# Patient Record
Sex: Female | Born: 1990 | Race: White | Hispanic: No | Marital: Single | Smoking: Never smoker
Health system: Southern US, Community
[De-identification: ages and names within clinical notes are randomized; demographics above are authoritative.]

## PROBLEM LIST (undated history)

## (undated) DIAGNOSIS — K219 Gastro-esophageal reflux disease without esophagitis: Secondary | ICD-10-CM

## (undated) DIAGNOSIS — R011 Cardiac murmur, unspecified: Secondary | ICD-10-CM

## (undated) DIAGNOSIS — L259 Unspecified contact dermatitis, unspecified cause: Secondary | ICD-10-CM

## (undated) DIAGNOSIS — G43909 Migraine, unspecified, not intractable, without status migrainosus: Secondary | ICD-10-CM

## (undated) DIAGNOSIS — F909 Attention-deficit hyperactivity disorder, unspecified type: Secondary | ICD-10-CM

## (undated) HISTORY — PX: OTHER SURGICAL HISTORY: SHX169

---

## 2013-09-09 ENCOUNTER — Ambulatory Visit: Payer: Self-pay

## 2017-10-01 ENCOUNTER — Other Ambulatory Visit: Payer: Self-pay

## 2017-10-01 ENCOUNTER — Encounter (HOSPITAL_COMMUNITY): Payer: Self-pay | Admitting: Emergency Medicine

## 2017-10-01 ENCOUNTER — Emergency Department (HOSPITAL_COMMUNITY)
Admission: EM | Admit: 2017-10-01 | Discharge: 2017-10-01 | Disposition: A | Discharge status: Home / Self Care | Payer: 59 | Attending: Emergency Medicine | Admitting: Emergency Medicine

## 2017-10-01 ENCOUNTER — Emergency Department (HOSPITAL_COMMUNITY): Payer: 59

## 2017-10-01 DIAGNOSIS — R195 Other fecal abnormalities: Secondary | ICD-10-CM | POA: Insufficient documentation

## 2017-10-01 DIAGNOSIS — Z79899 Other long term (current) drug therapy: Secondary | ICD-10-CM | POA: Insufficient documentation

## 2017-10-01 DIAGNOSIS — K2100 Gastro-esophageal reflux disease with esophagitis, without bleeding: Secondary | ICD-10-CM

## 2017-10-01 DIAGNOSIS — R1013 Epigastric pain: Secondary | ICD-10-CM | POA: Diagnosis present

## 2017-10-01 HISTORY — DX: Migraine, unspecified, not intractable, without status migrainosus: G43.909

## 2017-10-01 HISTORY — DX: Gastro-esophageal reflux disease without esophagitis: K21.9

## 2017-10-01 HISTORY — DX: Attention-deficit hyperactivity disorder, unspecified type: F90.9

## 2017-10-01 HISTORY — DX: Unspecified contact dermatitis, unspecified cause: L25.9

## 2017-10-01 HISTORY — DX: Cardiac murmur, unspecified: R01.1

## 2017-10-01 LAB — I-STAT CHEM 8, ED
BUN: 6 mg/dL (ref 6–20)
CHLORIDE: 103 mmol/L (ref 101–111)
CREATININE: 0.6 mg/dL (ref 0.44–1.00)
Calcium, Ion: 1.17 mmol/L (ref 1.15–1.40)
Glucose, Bld: 94 mg/dL (ref 65–99)
HEMATOCRIT: 43 % (ref 36.0–46.0)
Hemoglobin: 14.6 g/dL (ref 12.0–15.0)
POTASSIUM: 3.9 mmol/L (ref 3.5–5.1)
SODIUM: 140 mmol/L (ref 135–145)
TCO2: 24 mmol/L (ref 22–32)

## 2017-10-01 LAB — POC OCCULT BLOOD, ED: Fecal Occult Bld: POSITIVE — AB

## 2017-10-01 MED ORDER — GI COCKTAIL ~~LOC~~
30.0000 mL | Freq: Once | ORAL | Status: AC
  Administered 2017-10-01: 30 mL via ORAL
  Filled 2017-10-01: qty 30

## 2017-10-01 MED ORDER — LIDOCAINE VISCOUS 2 % MT SOLN: 15.0000 mL | Freq: Four times a day (QID) | OROMUCOSAL | 0 refills | Status: AC | PRN

## 2017-10-01 NOTE — ED Provider Notes (Signed)
[facility]ENC[facility] Provider Note   CSN: [id number] Arrival date & time: 10/01/17  1707     History   Chief Complaint Chief Complaint  Patient presents with  . Gastroesophageal Reflux    HPI Marilyn Kelly is a 26 y.o. female.  Patient with past medical history of GERD and hiatal hernia presents to the emergency department with a chief complaint of burning in throat and epigastric pain.  She states that the symptoms have been chronic, but worsened over the past several days.  She reports that her symptoms worsened after having an EGD.  He states that she had 3 biopsies taken at that time.  This was performed in [place].  She states that she has been taking Dexilant and simethicone with no relief.  She reports that she called her PCP, and was advised to come to the emergency department for evaluation.  She states that she may have had some blood in her stool, but is uncertain because she is starting her period.  She reports that the blood she has seen is red, not dark or black. She denies any fevers or chills.  Denies any other associated symptoms.   The history is provided by the patient. No language interpreter was used.    Past Medical History:  Diagnosis Date  . ADHD   . Contact dermatitis   . GERD (gastroesophageal reflux disease)   . Heart murmur   . Migraines     There are no active problems to display for this patient.   Past Surgical History:  Procedure Laterality Date  . lateral release on knee Right     OB History    No data available       Home Medications    Prior to Admission medications   Medication Sig Start Date End Date Taking? Authorizing Provider  alum & mag hydroxide-simeth (MAALOX/MYLANTA) 200-200-20 MG/5ML suspension Take 20 mLs every 6 (six) hours as needed by mouth for indigestion or heartburn.   Yes [provider]  cetirizine (ZYRTEC) 10 MG tablet Take 10 mg daily by mouth.   Yes [provider]  CONCERTA 36 MG CR tablet Take 36 mg daily by mouth. 09/16/17  Yes [provider]  DEXILANT 60 MG capsule Take 60 mg daily by mouth. 09/24/17  Yes [provider]  levonorgestrel-ethinyl estradiol (SEASONALE,INTROVALE,JOLESSA) 0.15-0.03 MG tablet Take 1 tablet daily by mouth. 07/15/17  Yes [provider]  pseudoephedrine (SUDAFED) 30 MG tablet Take 30 mg every 4 (four) hours as needed by mouth for congestion.   Yes [provider]  SUMAtriptan (IMITREX) 50 MG tablet Take 50 mg every 2 (two) hours as needed by mouth for migraine. May repeat in 2 hours if headache persists or recurs.   Yes [provider]    Family History History reviewed. No pertinent family history.  Social History Social History   Tobacco Use  . Smoking status: Never Smoker  . Smokeless tobacco: Never Used  Substance Use Topics  . Alcohol use: Yes    Comment: socially  . Drug use: Yes    Types: Marijuana     Allergies   Doxycycline; Naproxen; Ceftin [cefuroxime axetil]; and Tape   Review of Systems Review of Systems  All other systems reviewed and are negative.    Physical Exam Updated Vital Signs BP 134/84 (BP Location: Right Arm)   Pulse (!) 104   Temp 98.6 F (37 C) (Oral)   Resp 16  LMP 07/01/2017 Comment: On "seasonelle" birth control  SpO2 100%   Physical Exam  Constitutional: She is oriented to person, place, and time. She appears well-developed and well-nourished.  HENT:  Head: Normocephalic and atraumatic.  Eyes: Conjunctivae and EOM are normal. Pupils are equal, round, and reactive to light.  Neck: Normal range of motion. Neck supple.  Cardiovascular: Normal rate and regular rhythm. Exam reveals no gallop and no friction rub.  No murmur heard. Pulmonary/Chest: Effort normal and breath sounds normal. No respiratory distress. She has no wheezes. She has no rales. She exhibits no tenderness.  Abdominal: Soft. Bowel sounds are  normal. She exhibits no distension and no mass. There is no tenderness. There is no rebound and no guarding.  No focal abdominal tenderness, no RLQ tenderness or pain at McBurney's point, no RUQ tenderness or Murphy's sign, no left-sided abdominal tenderness, no fluid wave, or signs of peritonitis   Musculoskeletal: Normal range of motion. She exhibits no edema or tenderness.  Neurological: She is alert and oriented to person, place, and time.  Skin: Skin is warm and dry.  Psychiatric: She has a normal mood and affect. Her behavior is normal. Judgment and thought content normal.  Nursing note and vitals reviewed.    ED Treatments / Results  Labs (all labs ordered are listed, but only abnormal results are displayed) Labs Reviewed  POC OCCULT BLOOD, ED  I-STAT CHEM 8, ED    EKG  EKG Interpretation None       Radiology Dg Chest 2 View  Result Date: 10/01/2017 CLINICAL DATA:  Epigastric pain following endoscopy EXAM: CHEST  2 VIEW COMPARISON:  None. FINDINGS: The heart size and mediastinal contours are within normal limits. Both lungs are clear. The visualized skeletal structures are unremarkable.No free air. IMPRESSION: No free air or focal airspace disease. Electronically Signed   By: Deatra RobinsonKevin  Herman M.D.   On: 10/01/2017 18:39    Procedures Procedures (including critical care time)  Medications Ordered in ED Medications  gi cocktail (Maalox,Lidocaine,Donnatal) (not administered)  gi cocktail (Maalox,Lidocaine,Donnatal) (30 mLs Oral Given 10/01/17 1745)     Initial Impression / Assessment and Plan / ED Course  I have reviewed the triage vital signs and the nursing notes.  Pertinent labs & imaging results that were available during my care of the patient were reviewed by me and considered in my medical decision making (see chart for details).     Patient with burning in throat and epigastrium.  Recent EGD.  Known hx of esophagitis and GERD.  Currently taking Dexilant.   Improved after GI cocktail.  Stool occult blood test positive, but H/H is stable.  Abdomen is soft and non-tender.  No sign of acute abdomen.  CXR is normal, no free air.  Discussed with Dr. Eudelia Bunchardama, who recommends viscous lidocaine and maalox for home.  Discussed this with the patient, who understands and agrees with the plan.  She will follow-up with her GI.  [place] [facility] [place] [facility].  Final Clinical Impressions(s) / ED Diagnoses   Final diagnoses:  Esophagitis, reflux    ED Discharge Orders        Ordered    lidocaine (XYLOCAINE) 2 % solution  Every 6 hours PRN     10/01/17 2315       Roxy HorsemanBrowning, Tatelyn Vanhecke, PA-C 10/01/17 2320    Cardama, Amadeo GarnetPedro Eduardo, MD 10/02/17 0020

## 2017-10-01 NOTE — ED Notes (Signed)
Pt told nurse first tech that she had a BM, she observed her stool to be red and loose.

## 2017-10-01 NOTE — ED Notes (Signed)
Spoke to Dr. Eudelia Bunchardama about patient.  Orders placed per recommendations

## 2017-10-01 NOTE — ED Notes (Signed)
Patient verbalized understanding of discharge instructions and denies any further needs or questions at this time. VS stable. Patient ambulatory with steady gait.  

## 2017-10-01 NOTE — ED Triage Notes (Signed)
Pt presents to ED after having an Endoscopy on 11/7.  States she was diagnosed with hiatal hernia, gastritis.  No new medicines prescribed.  Pt states she has been having a significant increase in her reflux since the procedure, and then pt developed a sore throat yesterday which is worsening without relief from any OTC medicines.  Pt c/o epigastric pain.

## 2018-05-12 IMAGING — CR DG CHEST 2V
2 series · 2 of 2 positions shown · non-contrast
Comparison: None.

CLINICAL DATA: Epigastric pain following endoscopy

EXAM:
CHEST  2 VIEW

[chest pa]
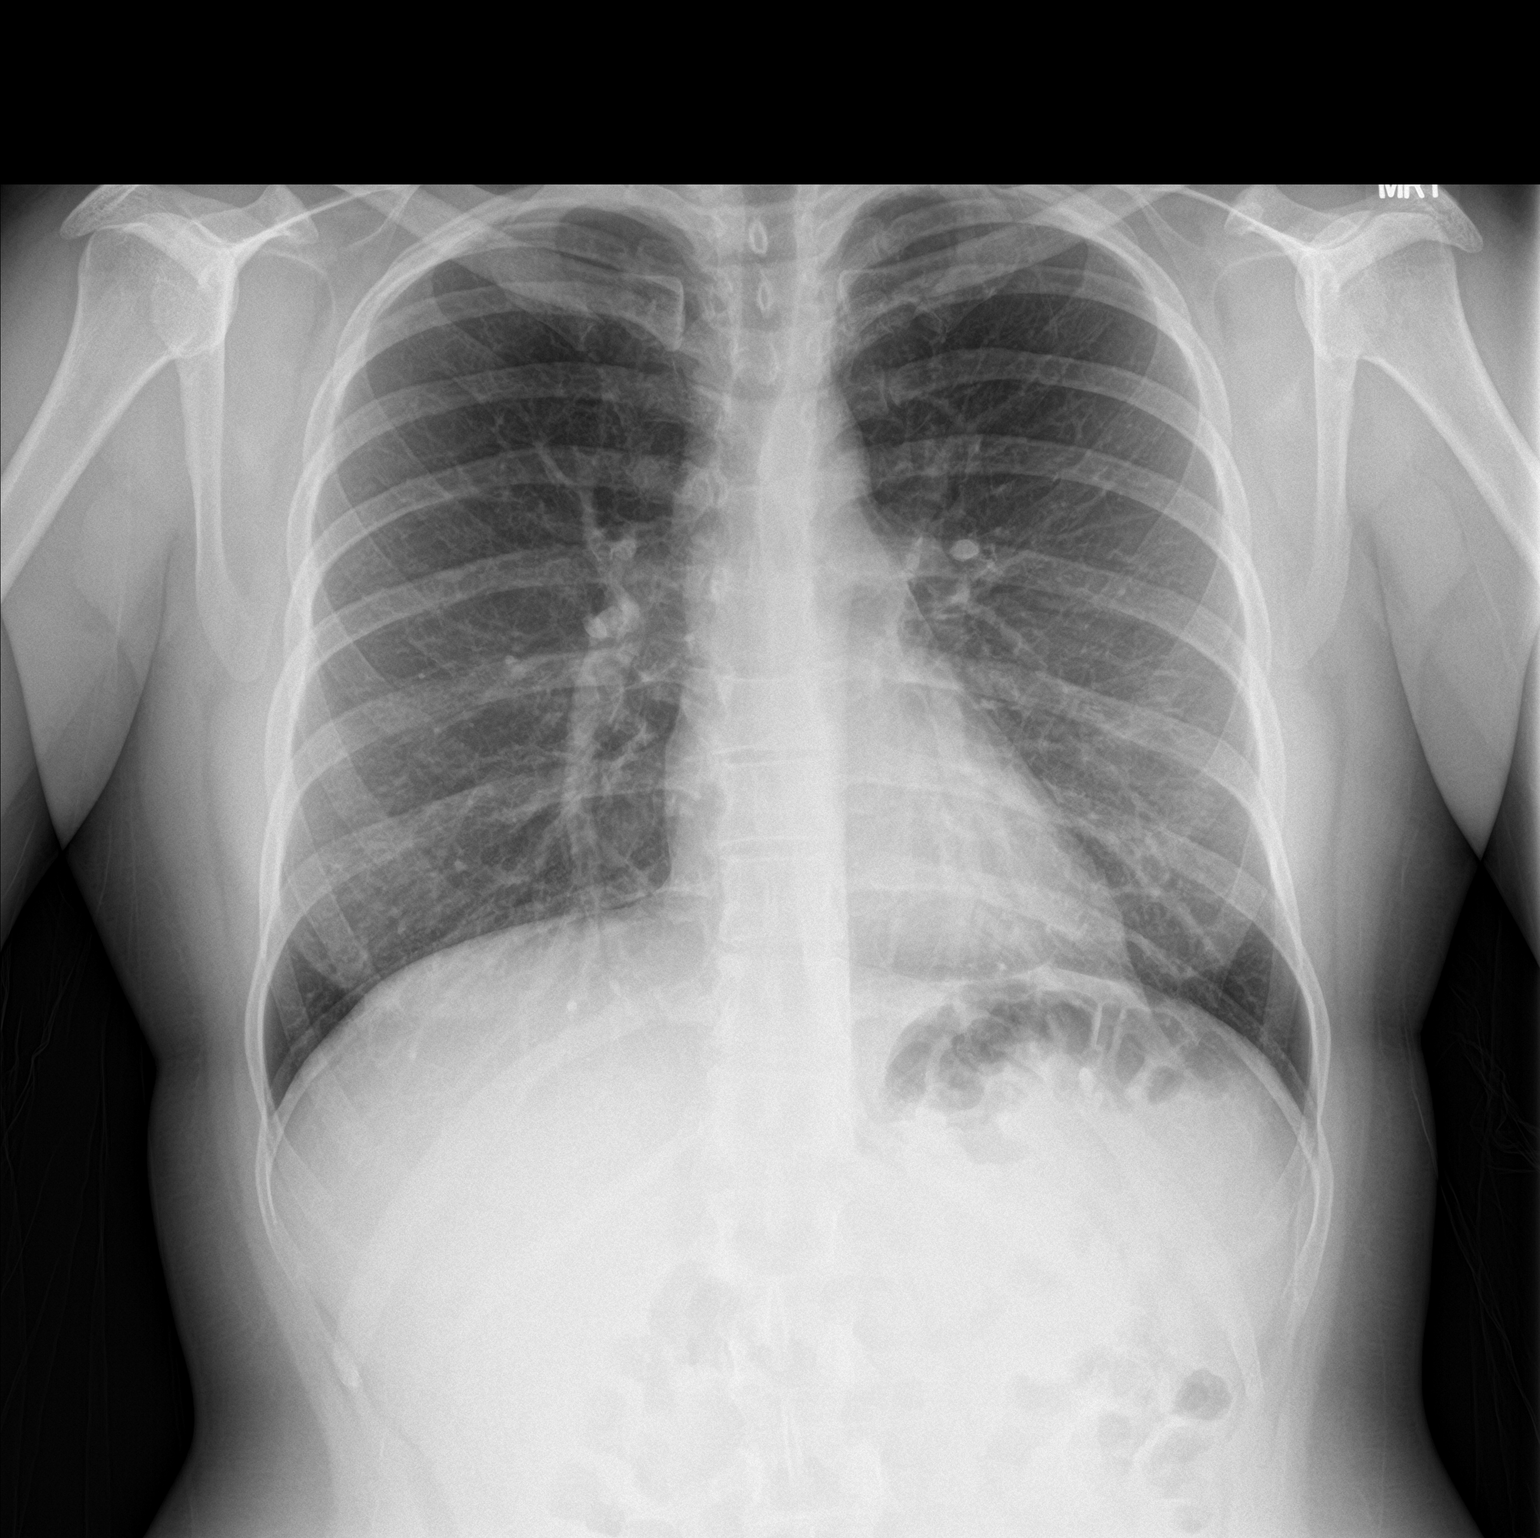

[chest lat]
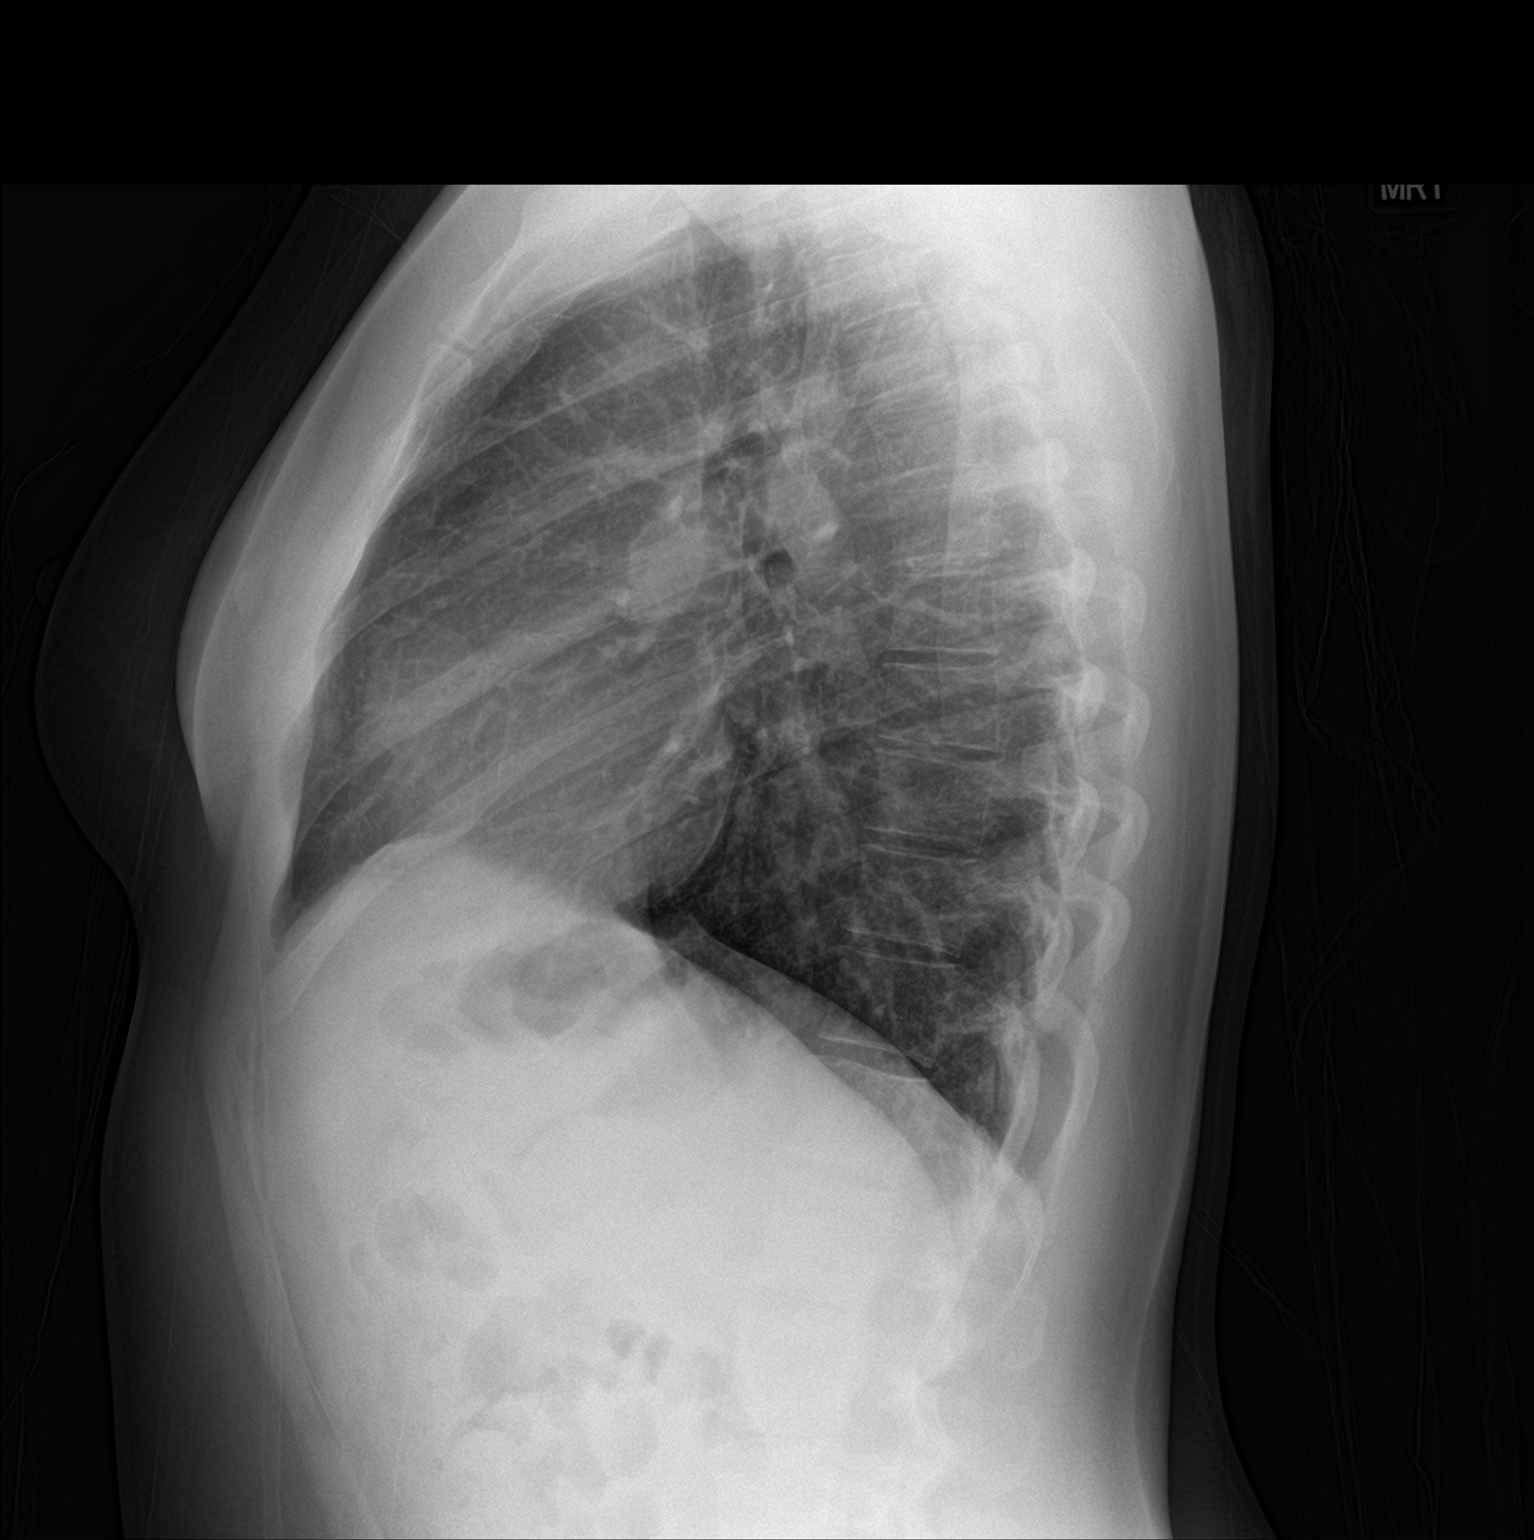

[2 of 2 positions shown; findings below may reference images not displayed]

FINDINGS: The heart size and mediastinal contours are within normal limits.
Both lungs are clear. The visualized skeletal structures are
unremarkable.No free air.
IMPRESSION: No free air or focal airspace disease.
